# Patient Record
Sex: Male | Born: 1995 | Hispanic: Yes | Marital: Single | Smoking: Never smoker
Health system: Southern US, Community
[De-identification: ages and names within clinical notes are randomized; demographics above are authoritative.]

---
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
## 2013-11-22 IMAGING — CR DG CHEST 1V PORT
1 series · 1 of 1 positions shown · non-contrast
Comparison: none

REASON FOR EXAM: post anesthesia, ams
COMMENTS:

PROCEDURE:     DXR - DXR PORTABLE CHEST SINGLE VIEW  - December 07, 2012  [DATE]
RESULT:     The lungs are clear. The heart and pulmonary vessels are normal.
The bony and mediastinal structures are unremarkable. There is no effusion.
There is no pneumothorax or evidence of congestive failure.

[ap]
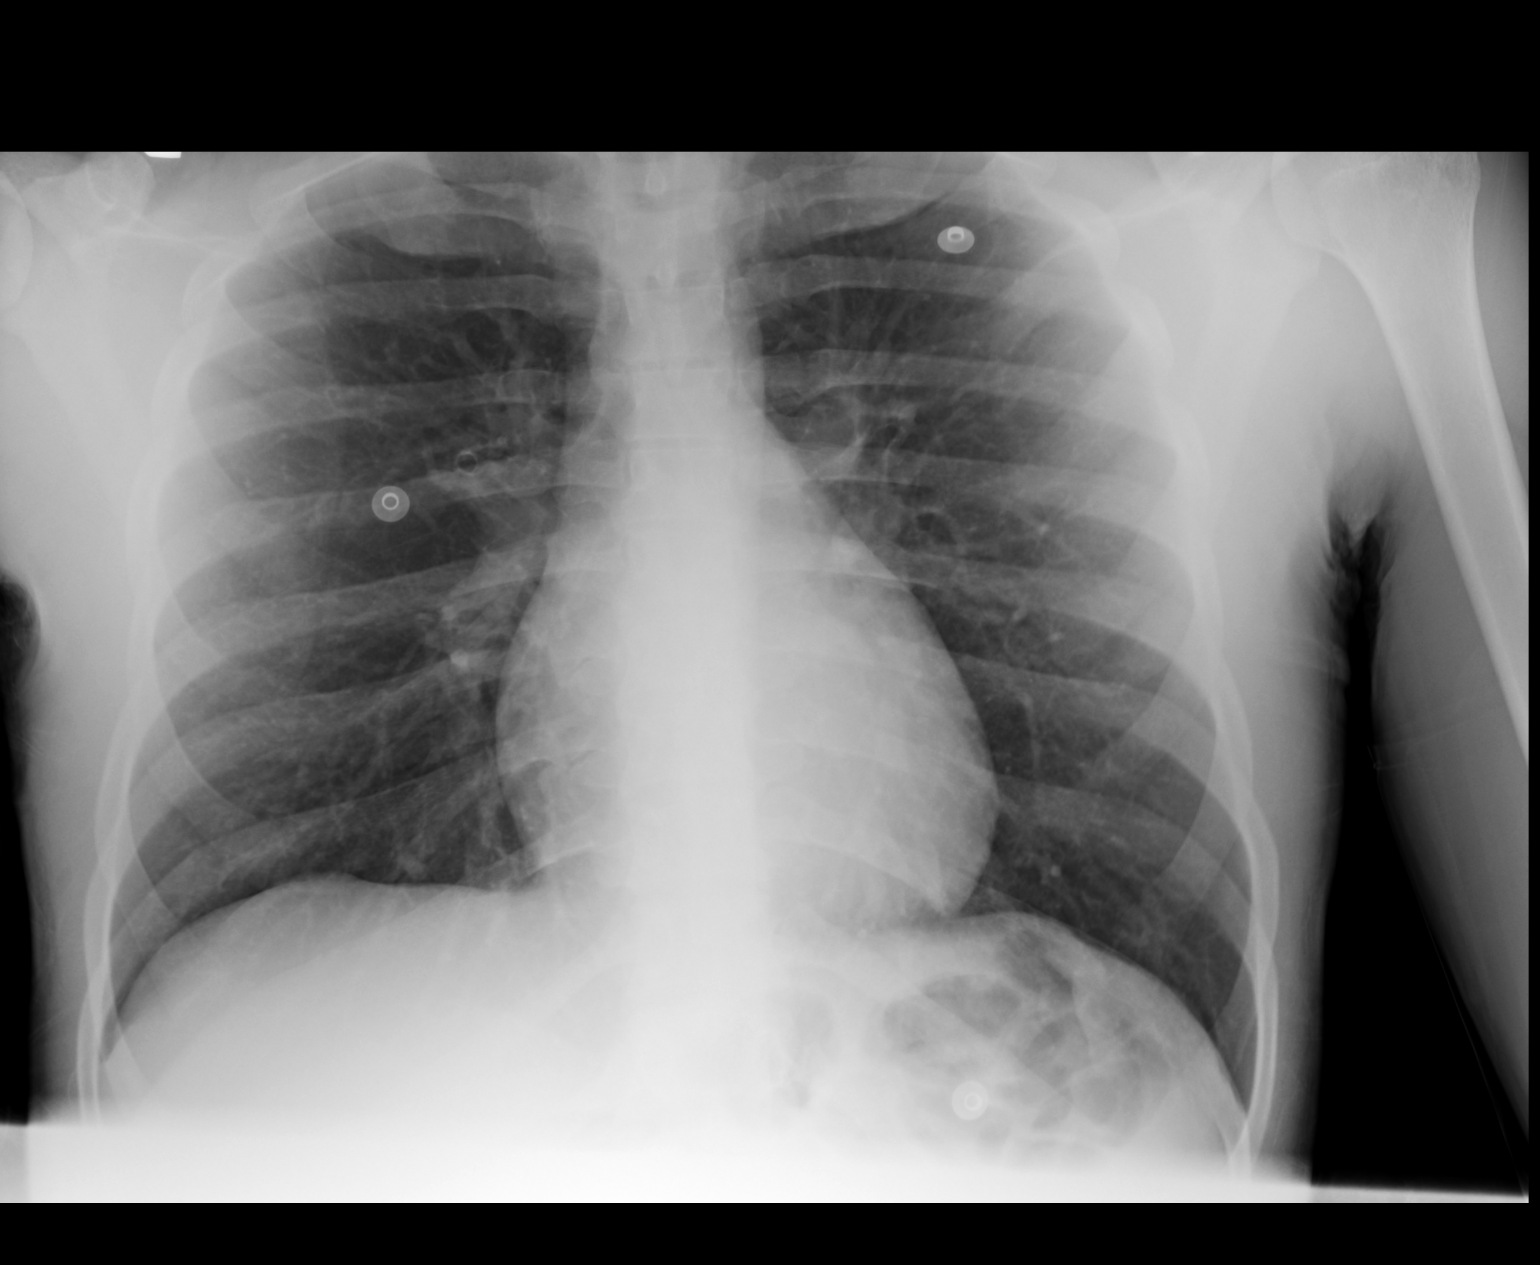

[1 of 1 positions shown; findings below may reference images not displayed]

IMPRESSION: No acute cardiopulmonary disease.

[REDACTED]

## 2013-11-22 IMAGING — CT CT HEAD WITHOUT CONTRAST
1 series · 16 of 30 positions shown, 20 images · non-contrast
Comparison: none

REASON FOR EXAM: headache, altered mental status
COMMENTS:

PROCEDURE:     CT  - CT HEAD WITHOUT CONTRAST  - December 07, 2012  [DATE]
RESULT:     Comparison:  10/19/2012
TECHNIQUE: Multiple axial images from the foramen magnum to the vertex were
obtained without IV contrast.

[Series 2: soft tissue · axial · 0.41mm/px · z∈[-138,+7]mm · 16 of 33 slices shown, 20 images]
[im 2/33  brain]
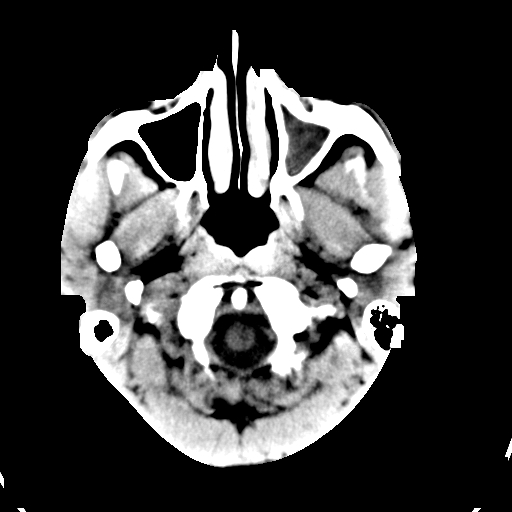
[im 2/33  bone]
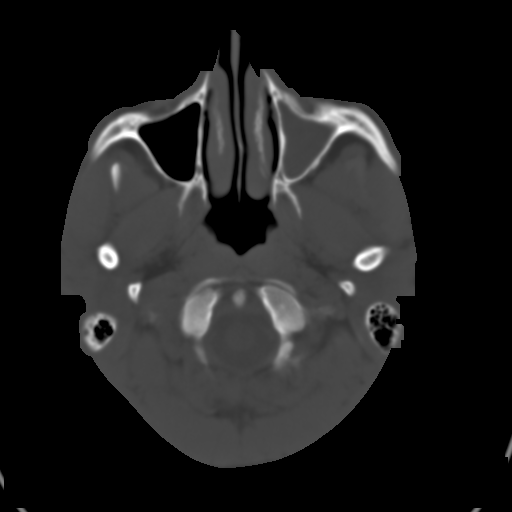
[im 4/33  brain]
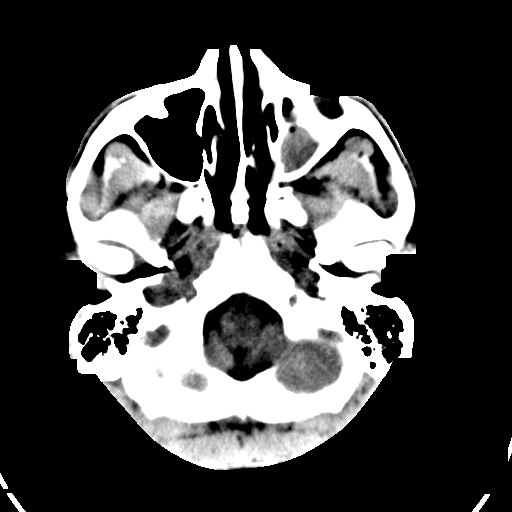
[im 6/33  brain]
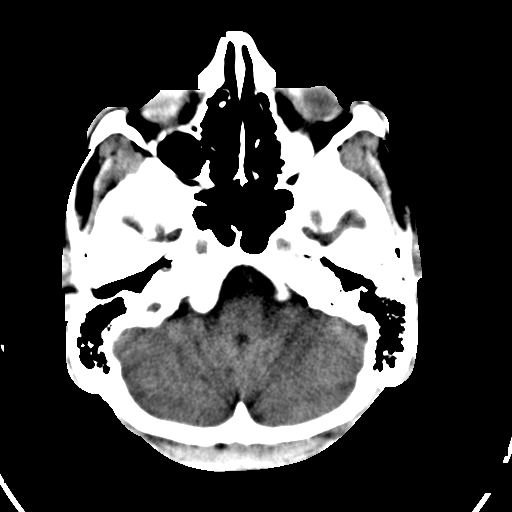
[im 8/33  brain]
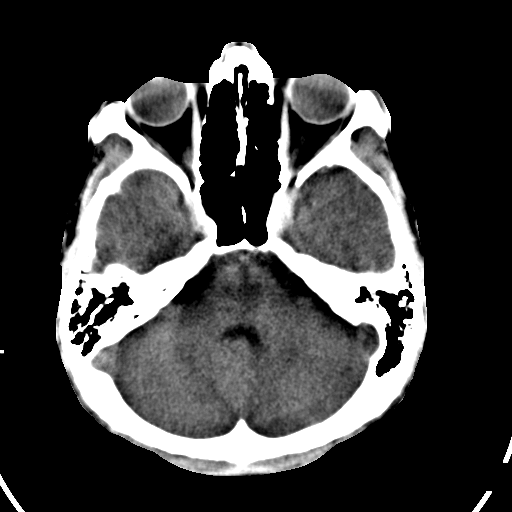
[im 9/33  brain]
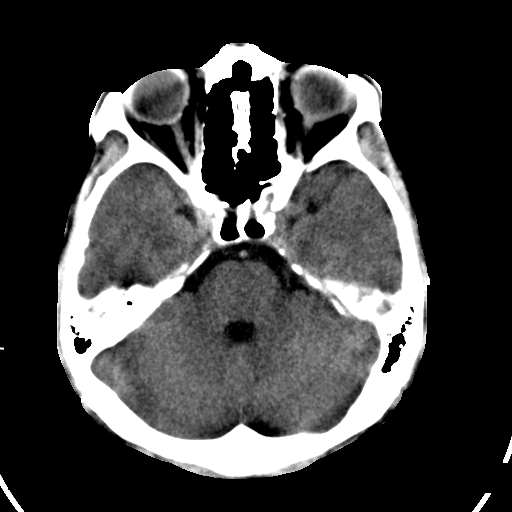
[im 9/33  bone]
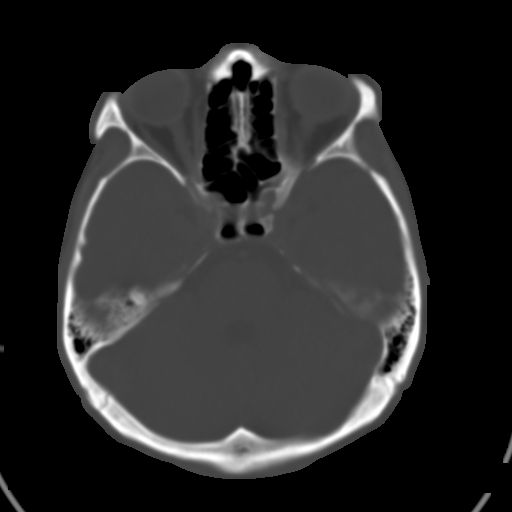
[im 12/33  brain]
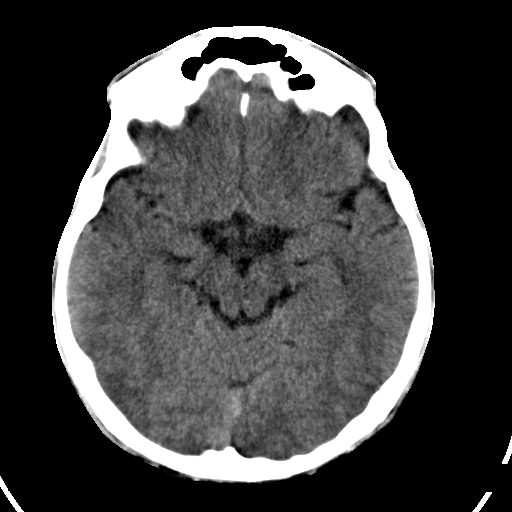
[im 14/33  brain]
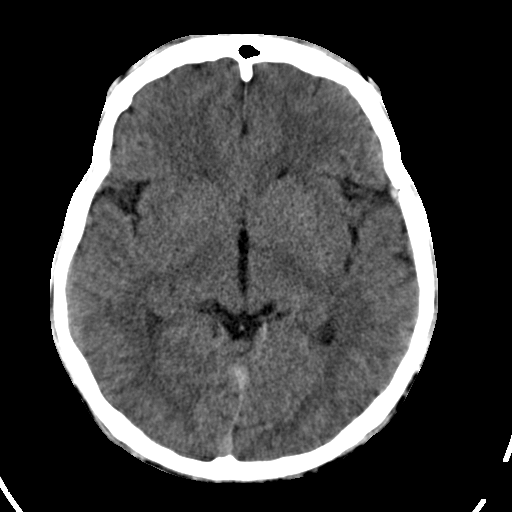
[im 16/33  brain]
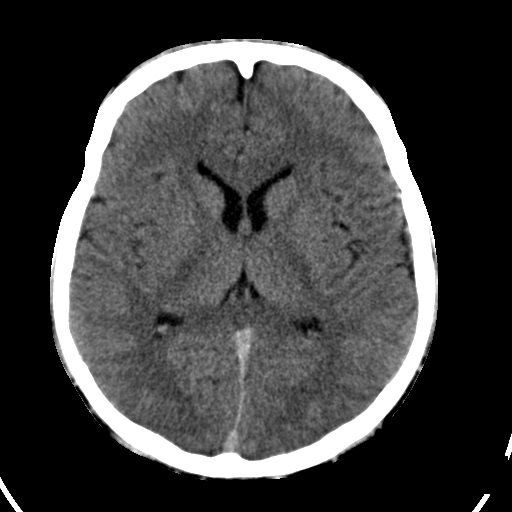
[im 17/33  brain]
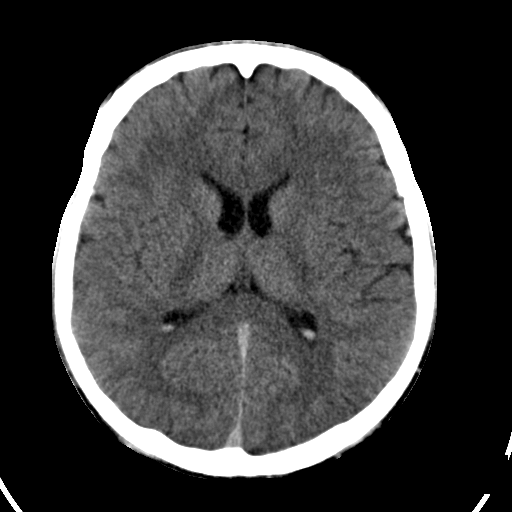
[im 17/33  bone]
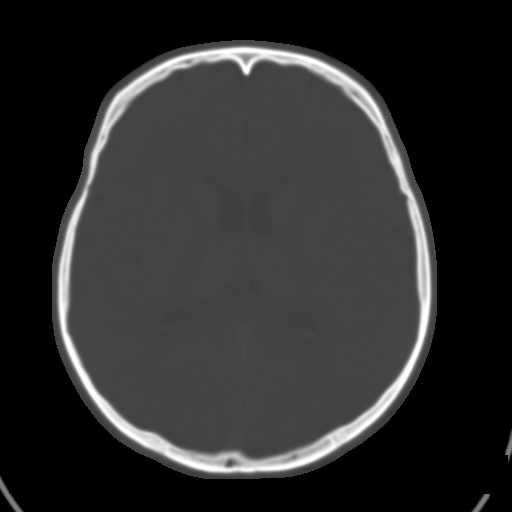
[im 19/33  brain]
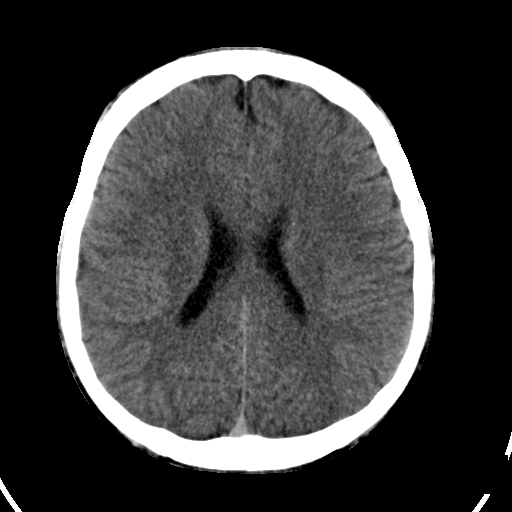
[im 21/33  brain]
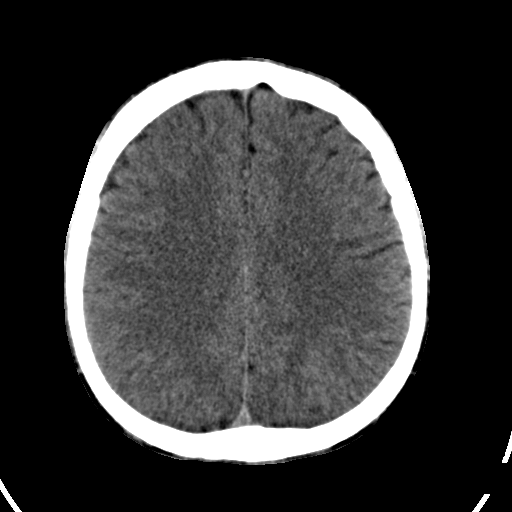
[im 24/33  brain]
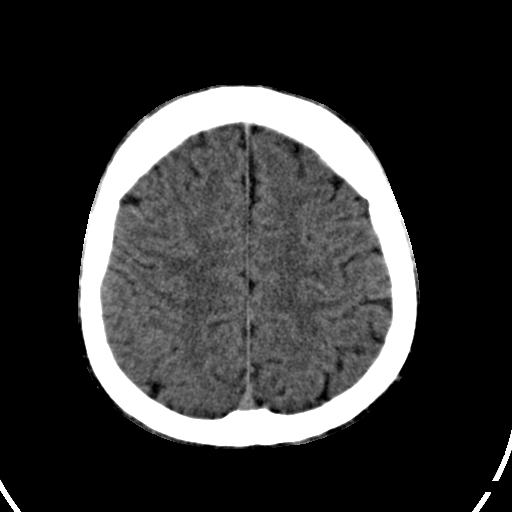
[im 25/33  brain]
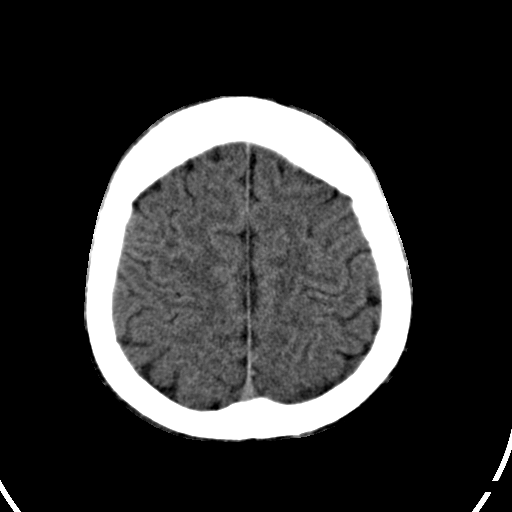
[im 25/33  bone]
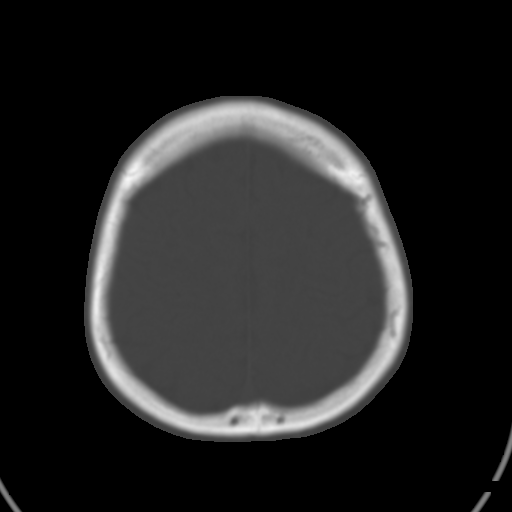
[im 27/33  brain]
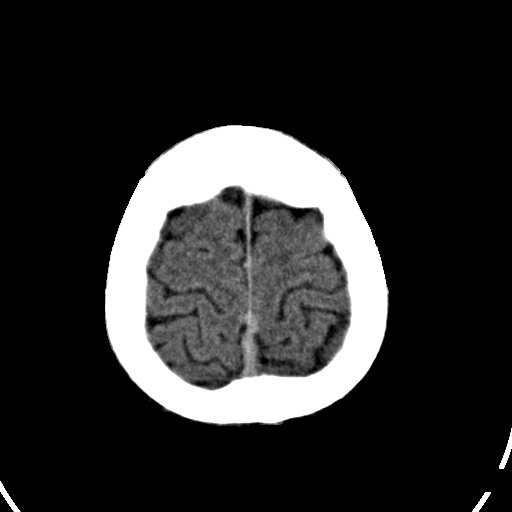
[im 29/33  brain]
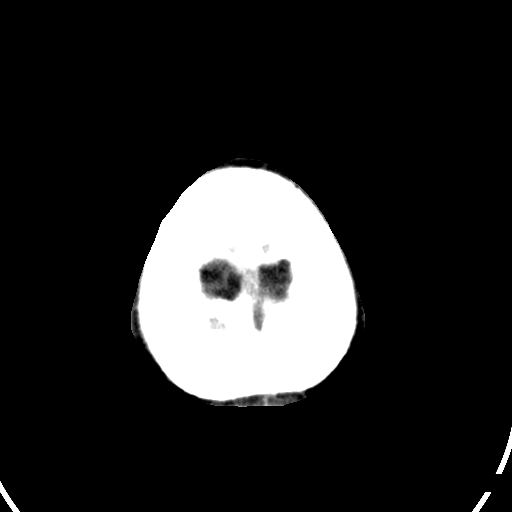
[im 31/33  brain]
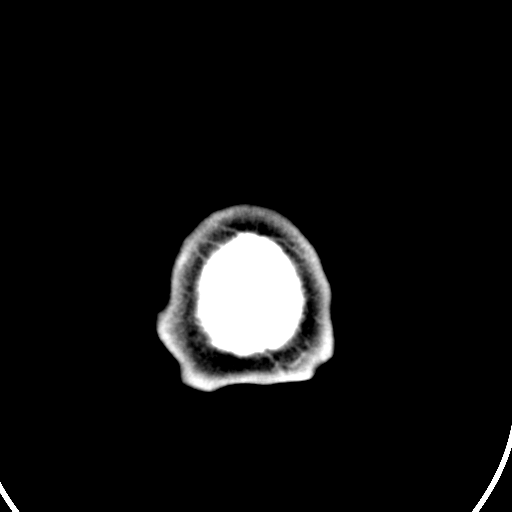

[16 of 30 positions shown; findings below may reference images not displayed]

FINDINGS: There is no evidence for mass effect, midline shift, or extra-axial fluid
collections. There is no evidence for space-occupying lesion, intracranial
hemorrhage, or cortical-based area of infarction.

There is near-complete opacification of the left maxillary sinus, similar to
prior.

The osseous structures are unremarkable.
IMPRESSION: No acute intracranial process.

[REDACTED]

## 2022-12-28 ENCOUNTER — Emergency Department
Admission: EM | Admit: 2022-12-28 | Discharge: 2022-12-28 | Disposition: A | Discharge status: Home / Self Care | Payer: Worker's Compensation | Attending: Emergency Medicine | Admitting: Emergency Medicine

## 2022-12-28 ENCOUNTER — Encounter: Payer: Self-pay | Admitting: Emergency Medicine

## 2022-12-28 ENCOUNTER — Other Ambulatory Visit: Payer: Self-pay

## 2022-12-28 ENCOUNTER — Emergency Department: Payer: Worker's Compensation

## 2022-12-28 DIAGNOSIS — S60221A Contusion of right hand, initial encounter: Secondary | ICD-10-CM | POA: Insufficient documentation

## 2022-12-28 DIAGNOSIS — S6991XA Unspecified injury of right wrist, hand and finger(s), initial encounter: Secondary | ICD-10-CM

## 2022-12-28 DIAGNOSIS — W230XXA Caught, crushed, jammed, or pinched between moving objects, initial encounter: Secondary | ICD-10-CM | POA: Diagnosis not present

## 2022-12-28 DIAGNOSIS — Y990 Civilian activity done for income or pay: Secondary | ICD-10-CM | POA: Diagnosis not present

## 2022-12-28 MED ORDER — CYCLOBENZAPRINE HCL 5 MG PO TABS: 5.0000 mg | ORAL_TABLET | Freq: Three times a day (TID) | ORAL | 0 refills | Status: AC | PRN

## 2022-12-28 MED ORDER — IBUPROFEN 800 MG PO TABS: 800.0000 mg | ORAL_TABLET | Freq: Three times a day (TID) | ORAL | 0 refills | Status: AC | PRN

## 2022-12-28 NOTE — ED Provider Notes (Signed)
[facility] Emergency Department Provider Note     Event Date/Time   First MD Initiated Contact with Patient 12/28/22 1822     (approximate)   History   Hand Injury   HPI  Austin Moon is a 27 y.o. male presents to the ED for evaluation of injury to his right hand.  Patient apparently was at work, when he got distracted while using them automated machine, causing his right hand primarily over the third fourth and fifth fingers to get caught between a rolling machine.  Patient was able to disengage to machine himself, remove exam without difficulty.  Patient medically continue to work for more than an hour following the injury that occurred about 215.  He was ultimately advised to report to the ED for evaluation by his supervisor.  He presents to the ED noting pain and decreased range of motion primarily to the third fourth and fifth fingers.  No other injury reported at this time.   Physical Exam   Triage Vital Signs: ED Triage Vitals  Enc Vitals Group     BP 12/28/22 1729 138/74     Pulse Rate 12/28/22 1729 73     Resp 12/28/22 1729 17     Temp 12/28/22 1729 98.1 F (36.7 C)     Temp Source 12/28/22 1729 Oral     SpO2 12/28/22 1729 98 %     Weight 12/28/22 1730 174 lb (78.9 kg)     Height --      Head Circumference --      Peak Flow --      Pain Score 12/28/22 1730 5     Pain Loc --      Pain Edu? --      Excl. in [place]? --     Most recent vital signs: Vitals:   12/28/22 1729  BP: 138/74  Pulse: 73  Resp: 17  Temp: 98.1 F (36.7 C)  SpO2: 98%    General Awake, no distress. NAD CV:  Good peripheral perfusion.  RESP:  Normal effort.  ABD:  No distention.  MSK:  RIGHT hand without gross deformity, soft tissue swelling, edema, erythema, or skin changes.  No abrasions, bruising, hematoma, or ecchymosis appreciated.  Patient with normal but slow flexion and extension range of the fingers.  No nailbed injuries appreciated. NEURO: Nerves II  through grossly intact.  Normal interest and opposition testing on exam.  Normal gross sensation.  Normal UE DTRs bilaterally.   ED Results / Procedures / Treatments   Labs (all labs ordered are listed, but only abnormal results are displayed) Labs Reviewed - No data to display   EKG   RADIOLOGY  I personally viewed and evaluated these images as part of my medical decision making, as well as reviewing the written report by the radiologist.  ED Provider Interpretation: no acute fracture  DG Hand Complete Right  Result Date: 12/28/2022 CLINICAL DATA:  Crushed injury. Third fourth and fifth fingers caught. Pain EXAM: RIGHT HAND - COMPLETE 3 VIEW COMPARISON:  None Available. FINDINGS: There is no evidence of fracture or dislocation. There is no evidence of arthropathy or other focal bone abnormality. Soft tissues are unremarkable. IMPRESSION: No acute osseous abnormality Electronically Signed   By: Jill Side M.D.   On: 12/28/2022 17:55     PROCEDURES:  Critical Care performed: No  .Splint Application  Date/Time: 12/28/2022 7:13 PM  Performed by: Melvenia Needles, PA-C Authorized by: Olegario Shearer  Berniece Salines, PA-C   Consent:    Consent obtained:  Verbal   Consent given by:  Patient   Risks, benefits, and alternatives were discussed: yes     Risks discussed:  Pain   Alternatives discussed:  No treatment Universal protocol:    Imaging studies available: yes     Site/side marked: yes     Patient identity confirmed:  Verbally with patient Pre-procedure details:    Distal neurologic exam:  Normal   Distal perfusion: distal pulses strong   Procedure details:    Location:  Hand   Hand location:  R hand   Upper extremity splint type: VOLAR HAND SPLINT.   Supplies:  Cotton padding, fiberglass and elastic bandage   Attestation: Splint applied and adjusted personally by me   Post-procedure details:    Distal neurologic exam:  Normal   Distal perfusion: distal pulses  strong     Procedure completion:  Tolerated well, no immediate complications   Post-procedure imaging: not applicable      MEDICATIONS ORDERED IN ED: Medications - No data to display   IMPRESSION / MDM / [place] / ED COURSE  I reviewed the triage vital signs and the nursing notes.                              Differential diagnosis includes, but is not limited to, crush injury, bony injury, neurovascular injury, nail injury or contusion, sprain, hematoma, laceration  Patient's presentation is most consistent with acute complicated illness / injury requiring diagnostic workup.  Patient's diagnosis is consistent with crush injury and head contusion without evidence of soft tissue injury, neurovascular injury, or bony injury, based on my interpretation of images.  Patient with a reassuring exam overall.  He will be placed in a volar splint for comfort at this point that he may wear for the range of the week.  Work that is provided, allow him to work with limited use of the right hand secondary to splint.. Patient will be discharged home with prescriptions for ibuprofen and Flexeril.  RICE instructions are provided.  Patient is to follow up with orthopedics or the medical provider approved by his company, as needed or otherwise directed. Patient is given ED precautions to return to the ED for any worsening or new symptoms.  FINAL CLINICAL IMPRESSION(S) / ED DIAGNOSES   Final diagnoses:  Injury of right hand, initial encounter  Contusion of right hand, initial encounter     Rx / DC Orders   ED Discharge Orders          Ordered    cyclobenzaprine (FLEXERIL) 5 MG tablet  3 times daily PRN        12/28/22 1852    ibuprofen (ADVIL) 800 MG tablet  Every 8 hours PRN        12/28/22 1852             Note:  This document was prepared using Dragon voice recognition software and may include unintentional dictation errors.    Melvenia Needles, PA-C 12/28/22  1931    Rada Hay, MD 12/28/22 2006

## 2022-12-28 NOTE — ED Notes (Addendum)
First Nurse Note: Patient brought to ED from [facility] for right hand injury. Patient unable to make a fist with hand.

## 2022-12-28 NOTE — ED Triage Notes (Signed)
Pt sts that he was at work and got his right third, fourth and fifth finger caught in a roller at work. Pt is complaining of pain at this time. Pt is able to move the three fingers but is having difficulty moving them.

## 2022-12-28 NOTE — Discharge Instructions (Signed)
Your exam and XR are reassuring. No evidence of bony injury, nerve injury, or vascular injury. You have some signs of hand pain and stiffness. Take the prescription meds as directed. Follow-up with Ortho or your company provider as needed. Apply ice to reduce pain and swelling and moist heat to promote range of motion.

## 2023-06-13 ENCOUNTER — Encounter: Payer: Self-pay | Admitting: Family Medicine

## 2023-06-13 ENCOUNTER — Ambulatory Visit: Payer: Self-pay | Admitting: Family Medicine

## 2023-06-13 DIAGNOSIS — Z113 Encounter for screening for infections with a predominantly sexual mode of transmission: Secondary | ICD-10-CM

## 2023-06-13 DIAGNOSIS — Z202 Contact with and (suspected) exposure to infections with a predominantly sexual mode of transmission: Secondary | ICD-10-CM

## 2023-06-13 LAB — HM HIV SCREENING LAB: HM HIV Screening: NEGATIVE

## 2023-06-13 MED ORDER — METRONIDAZOLE 500 MG PO TABS: 2000.0000 mg | ORAL_TABLET | Freq: Once | ORAL | Status: AC

## 2023-06-13 NOTE — Progress Notes (Signed)
[facility] Department STI clinic/screening visit  Subjective:  Austin Moon is a 27 y.o. male being seen today for an STI screening visit. The patient reports they do not have symptoms.    Patient has the following medical conditions:  There are no problems to display for this patient.    Chief Complaint  Patient presents with   SEXUALLY TRANSMITTED DISEASE    HPI  Patient reports to clinic as a contact to [facility]- pt's partner tested positive in maternity clinic today.  Last HIV test per patient/review of record was No results found for: "HMHIVSCREEN" No results found for: "HIV"  Does the patient or their partner desires a pregnancy in the next year? No  Screening for MPX risk: Does the patient have an unexplained rash? No Is the patient MSM? No Does the patient endorse multiple sex partners or anonymous sex partners? No Did the patient have close or sexual contact with a person diagnosed with MPX? No Has the patient traveled outside the [place] where MPX is endemic? No Is there a high clinical suspicion for MPX-- evidenced by one of the following No  -Unlikely to be chickenpox  -Lymphadenopathy  -Rash that present in same phase of evolution on any given body part   See flowsheet for further details and programmatic requirements.    There is no immunization history on file for this patient.   The following portions of the patient's history were reviewed and updated as appropriate: allergies, current medications, past medical history, past social history, past surgical history and problem list.  Objective:  There were no vitals filed for this visit.  Physical Exam Vitals and nursing note reviewed.  Constitutional:      Appearance: Normal appearance.  HENT:     Head: Normocephalic and atraumatic.     Mouth/Throat:     Mouth: Mucous membranes are moist.     Pharynx: No oropharyngeal exudate or posterior oropharyngeal erythema.  Eyes:     General:        Right  eye: No discharge.        Left eye: No discharge.     Conjunctiva/sclera:     Right eye: Right conjunctiva is not injected. No exudate.    Left eye: Left conjunctiva is not injected. No exudate. Pulmonary:     Effort: Pulmonary effort is normal.  Abdominal:     General: Abdomen is flat.     Palpations: Abdomen is soft. There is no hepatomegaly or mass.     Tenderness: There is no abdominal tenderness. There is no rebound.  Genitourinary:    Comments: Declined genital exam- asymptomatic Lymphadenopathy:     Cervical: No cervical adenopathy.     Upper Body:     Right upper body: No supraclavicular or axillary adenopathy.     Left upper body: No supraclavicular or axillary adenopathy.  Skin:    General: Skin is warm and dry.  Neurological:     Mental Status: He is alert and oriented to person, place, and time.       Assessment and Plan:  Austin Moon is a 27 y.o. male presenting to the [facility] Department for STI screening  1. Exposure to trichomonas  - metroNIDAZOLE (FLAGYL) 500 MG tablet; Take 4 tablets (2,000 mg total) by mouth once for 1 dose.  2. Screening for venereal disease  - HIV [place] [facility] - Syphilis Serology, [place] [facility] - Chlamydia/GC NAA, Confirmation   Patient does not have STI symptoms  Patient accepted all screenings including  urine GC/Chlamydia, and blood work for HIV/Syphilis. Patient meets criteria for HepB screening? No. Ordered? not applicable Patient meets criteria for HepC screening? No. Ordered? not applicable Recommended condom use with all sex Discussed importance of condom use for STI prevent  Treat positive test results per standing order. Discussed time line for [facility] results and that patient will be called with positive results and encouraged patient to call if he had not heard in 2 weeks Recommended repeat testing in 3 months with positive results. Recommended returning for continued or worsening symptoms.   Return if  symptoms worsen or fail to improve, for STI screening. Total time spent 20 minutes  No future appointments.  Lenice Llamas, [place]

## 2023-06-13 NOTE — Progress Notes (Signed)
Pt here for STI screening and as a contact to [facility].  No in house labs performed.  Metronidazole 500mg  #4 Take all 4 tablets at once dispensed to patient.  Counseled re medication, side effects, plan of care and when to contact clinic with questions or concerns.  Verbalizes understanding.  [facility] pamphlet given.  Condoms declined.-Collins Scotland, RN
